# Patient Record
Sex: Male | Born: 1995
Health system: Southern US, Community
[De-identification: ages and names within clinical notes are randomized; demographics above are authoritative.]

## PROBLEM LIST (undated history)

## (undated) DIAGNOSIS — T7840XA Allergy, unspecified, initial encounter: Secondary | ICD-10-CM

## (undated) HISTORY — DX: Allergy, unspecified, initial encounter: T78.40XA

---
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
## 2016-08-15 DIAGNOSIS — J020 Streptococcal pharyngitis: Secondary | ICD-10-CM | POA: Diagnosis not present

## 2016-08-15 DIAGNOSIS — J039 Acute tonsillitis, unspecified: Secondary | ICD-10-CM | POA: Diagnosis not present

## 2017-05-20 DIAGNOSIS — J019 Acute sinusitis, unspecified: Secondary | ICD-10-CM | POA: Diagnosis not present

## 2017-10-06 DIAGNOSIS — H5213 Myopia, bilateral: Secondary | ICD-10-CM | POA: Diagnosis not present

## 2017-10-09 DIAGNOSIS — J309 Allergic rhinitis, unspecified: Secondary | ICD-10-CM | POA: Diagnosis not present

## 2017-10-09 DIAGNOSIS — E669 Obesity, unspecified: Secondary | ICD-10-CM | POA: Diagnosis not present

## 2017-10-09 DIAGNOSIS — Z0000 Encounter for general adult medical examination without abnormal findings: Secondary | ICD-10-CM | POA: Diagnosis not present

## 2017-10-09 DIAGNOSIS — H6120 Impacted cerumen, unspecified ear: Secondary | ICD-10-CM | POA: Diagnosis not present

## 2021-08-21 DIAGNOSIS — J309 Allergic rhinitis, unspecified: Secondary | ICD-10-CM | POA: Diagnosis not present

## 2021-08-21 DIAGNOSIS — Z1331 Encounter for screening for depression: Secondary | ICD-10-CM | POA: Diagnosis not present

## 2021-08-21 DIAGNOSIS — E669 Obesity, unspecified: Secondary | ICD-10-CM | POA: Diagnosis not present

## 2021-08-21 DIAGNOSIS — Z13220 Encounter for screening for lipoid disorders: Secondary | ICD-10-CM | POA: Diagnosis not present

## 2021-08-21 DIAGNOSIS — Z113 Encounter for screening for infections with a predominantly sexual mode of transmission: Secondary | ICD-10-CM | POA: Diagnosis not present

## 2021-08-21 DIAGNOSIS — Z0000 Encounter for general adult medical examination without abnormal findings: Secondary | ICD-10-CM | POA: Diagnosis not present

## 2021-08-21 DIAGNOSIS — Z23 Encounter for immunization: Secondary | ICD-10-CM | POA: Diagnosis not present

## 2021-11-21 DIAGNOSIS — E669 Obesity, unspecified: Secondary | ICD-10-CM | POA: Diagnosis not present

## 2021-11-21 DIAGNOSIS — Z6836 Body mass index (BMI) 36.0-36.9, adult: Secondary | ICD-10-CM | POA: Diagnosis not present

## 2021-11-21 DIAGNOSIS — Z23 Encounter for immunization: Secondary | ICD-10-CM | POA: Diagnosis not present

## 2021-11-21 DIAGNOSIS — R234 Changes in skin texture: Secondary | ICD-10-CM | POA: Diagnosis not present

## 2021-11-21 DIAGNOSIS — R4589 Other symptoms and signs involving emotional state: Secondary | ICD-10-CM | POA: Diagnosis not present

## 2022-08-13 DIAGNOSIS — Z0000 Encounter for general adult medical examination without abnormal findings: Secondary | ICD-10-CM | POA: Diagnosis not present

## 2022-08-13 DIAGNOSIS — J309 Allergic rhinitis, unspecified: Secondary | ICD-10-CM | POA: Diagnosis not present

## 2022-08-13 DIAGNOSIS — Z6836 Body mass index (BMI) 36.0-36.9, adult: Secondary | ICD-10-CM | POA: Diagnosis not present

## 2022-08-13 DIAGNOSIS — F329 Major depressive disorder, single episode, unspecified: Secondary | ICD-10-CM | POA: Diagnosis not present

## 2022-08-13 DIAGNOSIS — R61 Generalized hyperhidrosis: Secondary | ICD-10-CM | POA: Diagnosis not present

## 2022-08-13 DIAGNOSIS — Z113 Encounter for screening for infections with a predominantly sexual mode of transmission: Secondary | ICD-10-CM | POA: Diagnosis not present

## 2022-08-13 DIAGNOSIS — Z13220 Encounter for screening for lipoid disorders: Secondary | ICD-10-CM | POA: Diagnosis not present

## 2022-08-13 DIAGNOSIS — E669 Obesity, unspecified: Secondary | ICD-10-CM | POA: Diagnosis not present

## 2022-08-14 DIAGNOSIS — R61 Generalized hyperhidrosis: Secondary | ICD-10-CM | POA: Diagnosis not present

## 2022-08-14 DIAGNOSIS — Z113 Encounter for screening for infections with a predominantly sexual mode of transmission: Secondary | ICD-10-CM | POA: Diagnosis not present

## 2022-08-14 DIAGNOSIS — Z13220 Encounter for screening for lipoid disorders: Secondary | ICD-10-CM | POA: Diagnosis not present

## 2022-11-28 ENCOUNTER — Other Ambulatory Visit: Payer: Self-pay

## 2022-11-28 ENCOUNTER — Emergency Department (HOSPITAL_COMMUNITY)
Admission: EM | Admit: 2022-11-28 | Discharge: 2022-11-28 | Disposition: A | Discharge status: Home / Self Care | Payer: Self-pay | Attending: Emergency Medicine | Admitting: Emergency Medicine

## 2022-11-28 DIAGNOSIS — M25572 Pain in left ankle and joints of left foot: Secondary | ICD-10-CM | POA: Insufficient documentation

## 2022-11-28 DIAGNOSIS — Y92410 Unspecified street and highway as the place of occurrence of the external cause: Secondary | ICD-10-CM | POA: Insufficient documentation

## 2022-11-28 DIAGNOSIS — M546 Pain in thoracic spine: Secondary | ICD-10-CM | POA: Diagnosis not present

## 2022-11-28 DIAGNOSIS — M542 Cervicalgia: Secondary | ICD-10-CM | POA: Insufficient documentation

## 2022-11-28 MED ORDER — IBUPROFEN 600 MG PO TABS: 600.0000 mg | ORAL_TABLET | Freq: Four times a day (QID) | ORAL | 0 refills | Status: AC | PRN

## 2022-11-28 MED ORDER — CYCLOBENZAPRINE HCL 10 MG PO TABS: 10.0000 mg | ORAL_TABLET | Freq: Two times a day (BID) | ORAL | 0 refills | Status: AC | PRN
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
## 2023-09-29 DIAGNOSIS — G8929 Other chronic pain: Secondary | ICD-10-CM | POA: Diagnosis not present

## 2023-09-29 DIAGNOSIS — Z13220 Encounter for screening for lipoid disorders: Secondary | ICD-10-CM | POA: Diagnosis not present

## 2023-09-29 DIAGNOSIS — J309 Allergic rhinitis, unspecified: Secondary | ICD-10-CM | POA: Diagnosis not present

## 2023-09-29 DIAGNOSIS — D72818 Other decreased white blood cell count: Secondary | ICD-10-CM | POA: Diagnosis not present

## 2023-09-29 DIAGNOSIS — Z0000 Encounter for general adult medical examination without abnormal findings: Secondary | ICD-10-CM | POA: Diagnosis not present

## 2023-09-29 DIAGNOSIS — E669 Obesity, unspecified: Secondary | ICD-10-CM | POA: Diagnosis not present

## 2023-09-29 DIAGNOSIS — Z113 Encounter for screening for infections with a predominantly sexual mode of transmission: Secondary | ICD-10-CM | POA: Diagnosis not present

## 2023-09-29 DIAGNOSIS — M25572 Pain in left ankle and joints of left foot: Secondary | ICD-10-CM | POA: Diagnosis not present

## 2023-09-29 DIAGNOSIS — F329 Major depressive disorder, single episode, unspecified: Secondary | ICD-10-CM | POA: Diagnosis not present

## 2023-09-30 DIAGNOSIS — Z113 Encounter for screening for infections with a predominantly sexual mode of transmission: Secondary | ICD-10-CM | POA: Diagnosis not present

## 2023-09-30 DIAGNOSIS — Z13220 Encounter for screening for lipoid disorders: Secondary | ICD-10-CM | POA: Diagnosis not present

## 2023-09-30 DIAGNOSIS — D72818 Other decreased white blood cell count: Secondary | ICD-10-CM | POA: Diagnosis not present
